# Patient Record
Sex: Male | Born: 1955
Health system: Southern US, Community
[De-identification: ages and names within clinical notes are randomized; demographics above are authoritative.]

---

## 2002-09-12 ENCOUNTER — Emergency Department (HOSPITAL_COMMUNITY): Admission: EM | Admit: 2002-09-12 | Discharge: 2002-09-12 | Payer: Self-pay | Admitting: Emergency Medicine

## 2002-09-12 ENCOUNTER — Encounter: Payer: Self-pay | Admitting: Emergency Medicine

## 2004-10-02 ENCOUNTER — Encounter: Admission: RE | Admit: 2004-10-02 | Discharge: 2004-10-02 | Payer: Self-pay | Admitting: Family Medicine

## 2005-11-21 ENCOUNTER — Encounter: Admission: RE | Admit: 2005-11-21 | Discharge: 2005-11-21 | Payer: Self-pay | Admitting: Family Medicine

## 2013-07-29 ENCOUNTER — Ambulatory Visit (INDEPENDENT_AMBULATORY_CARE_PROVIDER_SITE_OTHER): Payer: BC Managed Care – PPO | Admitting: Licensed Clinical Social Worker

## 2013-07-29 DIAGNOSIS — F4320 Adjustment disorder, unspecified: Secondary | ICD-10-CM

## 2013-08-26 ENCOUNTER — Ambulatory Visit (INDEPENDENT_AMBULATORY_CARE_PROVIDER_SITE_OTHER): Payer: BC Managed Care – PPO | Admitting: Licensed Clinical Social Worker

## 2013-08-26 DIAGNOSIS — F4320 Adjustment disorder, unspecified: Secondary | ICD-10-CM

## 2013-09-09 ENCOUNTER — Ambulatory Visit (INDEPENDENT_AMBULATORY_CARE_PROVIDER_SITE_OTHER): Payer: BC Managed Care – PPO | Admitting: Licensed Clinical Social Worker

## 2013-09-09 DIAGNOSIS — F4320 Adjustment disorder, unspecified: Secondary | ICD-10-CM

## 2013-10-19 ENCOUNTER — Emergency Department (HOSPITAL_COMMUNITY)
Admission: EM | Admit: 2013-10-19 | Discharge: 2013-10-20 | Disposition: A | Discharge status: Home / Self Care | Payer: BC Managed Care – PPO | Attending: Emergency Medicine | Admitting: Emergency Medicine

## 2013-10-19 DIAGNOSIS — IMO0002 Reserved for concepts with insufficient information to code with codable children: Secondary | ICD-10-CM | POA: Diagnosis not present

## 2013-10-19 DIAGNOSIS — T169XXA Foreign body in ear, unspecified ear, initial encounter: Secondary | ICD-10-CM | POA: Diagnosis not present

## 2013-10-19 DIAGNOSIS — Y9289 Other specified places as the place of occurrence of the external cause: Secondary | ICD-10-CM | POA: Insufficient documentation

## 2013-10-19 DIAGNOSIS — Z79899 Other long term (current) drug therapy: Secondary | ICD-10-CM | POA: Insufficient documentation

## 2013-10-19 DIAGNOSIS — T161XXA Foreign body in right ear, initial encounter: Secondary | ICD-10-CM

## 2013-10-19 DIAGNOSIS — Y9389 Activity, other specified: Secondary | ICD-10-CM | POA: Diagnosis not present

## 2013-10-19 MED ORDER — ACETAMINOPHEN-CODEINE #3 300-30 MG PO TABS: 1.0000 | ORAL_TABLET | Freq: Four times a day (QID) | ORAL | Status: DC | PRN

## 2013-10-19 MED ORDER — NAPROXEN 500 MG PO TABS: 500.0000 mg | ORAL_TABLET | Freq: Two times a day (BID) | ORAL | Status: DC | PRN

## 2013-10-19 MED ORDER — LIDOCAINE HCL 2 % IJ SOLN
5.0000 mL | Freq: Once | INTRAMUSCULAR | Status: DC
  Filled 2013-10-19: qty 20

## 2013-10-19 MED ORDER — NEOMYCIN-POLYMYXIN-HC 3.5-10000-1 OT SUSP: 4.0000 [drp] | Freq: Four times a day (QID) | OTIC | Status: DC

## 2013-10-19 NOTE — ED Provider Notes (Signed)
CSN: [id number]     Arrival date & time 10/19/13  2104 History  This chart was scribed for a non-physician practitioner, [place]ita [place] Camprubi-Soms, PA-C working with Merrie Roof, MD by [place] Peace, ED Scribe. The patient was seen in [facility]. The patient's care was started at 10:47 PM.    Chief Complaint  Patient presents with  . Insect in ear      Patient is a 58 y.o. male presenting with foreign body in ear. The history is provided by the patient. No language interpreter was used.  Foreign Body in Ear This is a new problem. The current episode started today. The problem has been unchanged. Pertinent negatives include no headaches, nausea, neck pain, vertigo, visual change or vomiting. Nothing aggravates the symptoms. Treatments tried: irrigation and q-tip. The treatment provided no relief.   HPI Comments: Manuel Ellis is a 58 y.o. Healthy male who presents to the Emergency Department complaining of insect in right ear onset 8:30 PM this evening that occurred while pt was outside doing some yard work when he felt a bug fly in. Pt currently reports that he feels the bug "fluttering" and "moving around". He notes that he is not in any pain but rather "annoyed". Pt reports that he has tried flushing ear out and using a q-tip to remove insect without any success. Pt denies history of DM or similar occurences in the past. Pt denies any tinnitus or drainage in affected ear. He further denies experiencing any headaches, blurred vision, vertigo, nausea, or vomiting. States he feels his hearing is "muted" in that ear.   No past medical history on file. No past surgical history on file. No family history on file. History  Substance Use Topics  . Smoking status: Not on file  . Smokeless tobacco: Not on file  . Alcohol Use: Not on file    Review of Systems  HENT: Positive for hearing loss ("muted"). Negative for ear discharge, ear pain and tinnitus.        Insect in his right  ear.   Eyes: Negative for visual disturbance.  Gastrointestinal: Negative for nausea and vomiting.  Musculoskeletal: Negative for neck pain.  Skin: Negative for wound.  Neurological: Negative for dizziness, vertigo, syncope, light-headedness and headaches.  10 Systems reviewed and are negative for acute change except as noted in the HPI.    Allergies  Review of patient's allergies indicates no known allergies.  Home Medications   Prior to Admission medications   Medication Sig Start Date End Date Taking? Authorizing Provider  Fluticasone-Salmeterol (ADVAIR) 250-50 MCG/DOSE AEPB Inhale 1 puff into the lungs daily as needed. For seasonal allergies   Yes Historical Provider, MD  simvastatin (ZOCOR) 10 MG tablet Take 10 mg by mouth daily.   Yes Historical Provider, MD   BP 142/90  Pulse 78  Temp(Src) 98.5 F (36.9 C) (Oral)  Resp 18  SpO2 95% Physical Exam  Nursing note and vitals reviewed. Constitutional: He is oriented to person, place, and time. Vital signs are normal. He appears well-developed and well-nourished. No distress.  Appears frustrated with buzzing in R ear, otherwise nontoxic and well appearing  HENT:  Head: Normocephalic and atraumatic.  Right Ear: Tympanic membrane normal. No lacerations. There is tenderness (discomfort with pinna movement). No drainage or swelling. A foreign body (insect) is present. No mastoid tenderness. Tympanic membrane is not injected, not perforated and not erythematous. No middle ear effusion. Decreased hearing ("muted") is noted.  Left Ear: Hearing, tympanic  membrane, external ear and ear canal normal.  Nose: Nose normal.  Mouth/Throat: Mucous membranes are normal.  R ear with "muted" hearing, mild discomfort with pinna movement, no drainage or swelling noted to canal but insect occluding full visualization. After insect removed, canal noted to be macerated and erythematous, no swelling or lacerations, TM intact and noninjected. L ear clear   Eyes: Conjunctivae and EOM are normal.  Neck: Normal range of motion. Neck supple.  Cardiovascular: Normal rate.   Pulmonary/Chest: Effort normal. No respiratory distress.  Abdominal: Normal appearance. He exhibits no distension.  Musculoskeletal: Normal range of motion.  Neurological: He is alert and oriented to person, place, and time.  Skin: Skin is warm, dry and intact. No rash noted. No erythema.  Psychiatric: He has a normal mood and affect. His behavior is normal.    ED Course  FOREIGN BODY REMOVAL Date/Time: 10/19/2013 11:11 PM Performed by: Marjean Donna, Daionna Crossland STRUPP Authorized by: Ramond Marrow Consent: Verbal consent obtained. Risks and benefits: risks, benefits and alternatives were discussed Consent given by: patient Patient understanding: patient states understanding of the procedure being performed Patient consent: the patient's understanding of the procedure matches consent given Patient identity confirmed: verbally with patient Body area: ear Location details: right ear Anesthesia: local infiltration Local anesthetic: lidocaine 2% without epinephrine Anesthetic total: 10 ml Patient sedated: no Patient restrained: no Patient cooperative: yes Localization method: ENT speculum Removal mechanism: irrigation and curette Complexity: simple 1 objects recovered. Objects recovered: winged insect, possible bee Post-procedure assessment: foreign body removed Patient tolerance: Patient tolerated the procedure well with no immediate complications.   (including critical care time) Labs Review Labs Reviewed - No data to display  Imaging Review No results found.   EKG Interpretation None     Medications - No data to display  10:49 PM- Treatment plan was discussed with patient who verbalizes understanding and agrees which includes Lidocaine 2%  and irrigation of ear.   11:11 PM- Lidocaine 2% injected into ear and irrigation of ear performed.  Curette used to remove insect from ear. Pt is to follow up with his PCP, Dr. Manus Gunning. He is also advised that to return here to ED if any further problems arise.    MDM   Final diagnoses:  Acute foreign body of ear canal, right, initial encounter    58y/o male with insect in R ear, successfully removed with lido irrigation and lighted curette, but very macerated ear canal noted after. Given cortisporin drops to cover for infection. Pt is not diabetic. Will have him f/up with PCP in 3 days for recheck. Given pain meds for home. I explained the diagnosis and have given explicit precautions to return to the ER including for any other new or worsening symptoms. The patient understands and accepts the medical plan as it's been dictated and I have answered their questions. Discharge instructions concerning home care and prescriptions have been given. The patient is STABLE and is discharged to home in good condition.   I personally performed the services described in this documentation, which was scribed in my presence. The recorded information has been reviewed and is accurate.  BP 142/90  Pulse 78  Temp(Src) 98.5 F (36.9 C) (Oral)  Resp 18  SpO2 95%  Meds ordered this encounter  Medications  . lidocaine (XYLOCAINE) 2 % (with pres) injection 100 mg    Sig:   . acetaminophen-codeine (TYLENOL #3) 300-30 MG per tablet    Sig: Take 1 tablet by mouth every 6 (six)  hours as needed for moderate pain.    Dispense:  10 tablet    Refill:  0    Order Specific Question:  Supervising Provider    Answer:  Eber Hong D [[id number]]  . neomycin-polymyxin-hydrocortisone (CORTISPORIN) 3.5-10000-1 otic suspension    Sig: Place 4 drops into the right ear 4 (four) times daily. X 7 days    Dispense:  10 mL    Refill:  0    Order Specific Question:  Supervising Provider    Answer:  Eber Hong D [[id number]]  . naproxen (NAPROSYN) 500 MG tablet    Sig: Take 1 tablet (500 mg total) by mouth 2 (two) times daily as  needed for mild pain, moderate pain or headache (TAKE WITH MEALS.).    Dispense:  20 tablet    Refill:  0    Order Specific Question:  Supervising Provider    Answer:  Vida Roller [address] Tylea Hise Camprubi-Soms, PA-C 10/19/13 [phone]

## 2013-10-19 NOTE — Discharge Instructions (Signed)
The insect in your ear was removed successfully, but he did cause some irritation in your ear canal. You've been given drops to help with pain, as well as cover you for any infection that may result, use them as directed. Use naprosyn and tylenol #3 as needed, as directed for pain. See your regular doctor for a check up in 3 days. If you develop fevers or pus drainage from your ear, complete loss of hearing, scalp swelling around your ear, or any other worsening or changing symptoms, return to the ER immediately.    Ear Foreign Body An ear foreign body is an object that is stuck in the ear. It is common for young children to put objects into the ear canal. These may include pebbles, beads, beans, and any other small objects which will fit. In adults, objects such as cotton swabs may become lodged in the ear canal. In all ages, the most common foreign bodies are insects that enter the ear canal.  SYMPTOMS  Foreign bodies may cause pain, buzzing or roaring sounds, hearing loss, and ear drainage.  HOME CARE INSTRUCTIONS   Keep all follow-up appointments with your caregiver as told.  Keep small objects out of reach of young children. Tell them not to put anything in their ears. SEEK IMMEDIATE MEDICAL CARE IF:   You have bleeding from the ear.  You have increased pain or swelling of the ear.  You have reduced hearing.  You have discharge coming from the ear.  You have a fever.  You have a headache. MAKE SURE YOU:   Understand these instructions.  Will watch your condition.  Will get help right away if you are not doing well or get worse. Document Released: 01/14/2000 Document Revised: 04/10/2011 Document Reviewed: 09/04/2007 [facility] Patient Information 2015 [place], [place]. This information is not intended to replace advice given to you by your health care provider. Make sure you discuss any questions you have with your health care provider.

## 2013-10-19 NOTE — ED Notes (Addendum)
Pt states an insect flew in his right ear while he was doing yard work at 830pm today. Pt states the insect is not causing pain, but is "annoying".  Pt states he tried flushing the insect out with water and using a q-tip without success.

## 2013-10-20 NOTE — ED Provider Notes (Signed)
Medical screening examination/treatment/procedure(s) were performed by non-physician practitioner and as supervising physician I was immediately available for consultation/collaboration.   EKG Interpretation None        Konrad Hoak David Emiliya Chretien III, MD 10/20/13 0002 

## 2014-08-11 ENCOUNTER — Ambulatory Visit: Payer: Self-pay | Admitting: Licensed Clinical Social Worker

## 2015-05-25 ENCOUNTER — Other Ambulatory Visit: Payer: Self-pay | Admitting: Sports Medicine

## 2015-05-25 DIAGNOSIS — M25511 Pain in right shoulder: Secondary | ICD-10-CM

## 2015-06-01 ENCOUNTER — Ambulatory Visit
Admission: RE | Admit: 2015-06-01 | Discharge: 2015-06-01 | Disposition: A | Discharge status: Home / Self Care | Payer: Self-pay | Source: Ambulatory Visit | Attending: Sports Medicine | Admitting: Sports Medicine

## 2015-06-01 ENCOUNTER — Ambulatory Visit
Admission: RE | Admit: 2015-06-01 | Discharge: 2015-06-01 | Disposition: A | Discharge status: Home / Self Care | Payer: BLUE CROSS/BLUE SHIELD | Source: Ambulatory Visit | Attending: Sports Medicine | Admitting: Sports Medicine

## 2015-06-01 DIAGNOSIS — M25511 Pain in right shoulder: Secondary | ICD-10-CM

## 2015-06-01 MED ORDER — IOPAMIDOL (ISOVUE-M 200) INJECTION 41%
15.0000 mL | Freq: Once | INTRAMUSCULAR | Status: AC
  Administered 2015-06-01: 15 mL via INTRA_ARTICULAR

## 2015-11-22 ENCOUNTER — Ambulatory Visit (INDEPENDENT_AMBULATORY_CARE_PROVIDER_SITE_OTHER): Payer: Self-pay | Admitting: Orthopedic Surgery

## 2015-12-02 ENCOUNTER — Ambulatory Visit (INDEPENDENT_AMBULATORY_CARE_PROVIDER_SITE_OTHER): Payer: BLUE CROSS/BLUE SHIELD | Admitting: Orthopedic Surgery

## 2015-12-02 ENCOUNTER — Encounter (INDEPENDENT_AMBULATORY_CARE_PROVIDER_SITE_OTHER): Payer: Self-pay | Admitting: Orthopedic Surgery

## 2015-12-02 DIAGNOSIS — M75121 Complete rotator cuff tear or rupture of right shoulder, not specified as traumatic: Secondary | ICD-10-CM

## 2015-12-02 NOTE — Progress Notes (Signed)
   Post-Op Visit Note   Patient: Manuel Ellis           Date of Birth: 01/30/1956           MRN: [id number] Visit Date: 12/02/2015 PCP: Thora LanceEHINGER,ROBERT R, MD   Assessment & Plan:  Chief Complaint:  Chief Complaint  Patient presents with  . Right Shoulder - Follow-up   Visit Diagnoses:  1. Complete tear of right rotator cuff   Manuel Ellis is a 60 year old patient who is about 5 months out right shoulder arthroscopy biceps tendon tenodesis rotator cuff repair generally doing well in excess program twice a week therapy once a week works as a Designer, industrial/productwarehouse manager is doing weights exercises with also  a band  Plan: Manuel Ellis is doing well following his shoulder surgery.  He has excellent range of motion and improving strength.  Smooth range with active and passive range of motion on exam today.  I did let him transition out of therapy into a home exercise program over the next month follow-up with me as needed  Follow-Up Instructions: No Follow-up on file.   Orders:  No orders of the defined types were placed in this encounter.  No orders of the defined types were placed in this encounter.  All systems reviewed negative as they relate to the chief complaint On exam he has full active and passive range of motion of right shoulder with good rotator cuff strength on the right-hand side interspace express upset muscle testing.  No Popeye deformity and biceps region.  He is otherwise appears healthy while the [place] 60 stress are nontender white mass index Florastor effort normal heart rate normal mood and affect normal skin no lymphadenopathy PMFS History: There are no active problems to display for this patient.  No past medical history on file.  No family history on file.  No past surgical history on file. Social History   Occupational History  . Not on file.   Social History Main Topics  . Smoking status: Never Smoker  . Smokeless tobacco: Never Used  . Alcohol use Yes    Comment: occads  . Drug use: No  . Sexual activity: Not on file

## 2018-02-11 ENCOUNTER — Ambulatory Visit (INDEPENDENT_AMBULATORY_CARE_PROVIDER_SITE_OTHER): Payer: BLUE CROSS/BLUE SHIELD | Admitting: Family Medicine

## 2018-02-11 ENCOUNTER — Ambulatory Visit (INDEPENDENT_AMBULATORY_CARE_PROVIDER_SITE_OTHER): Payer: Self-pay

## 2018-02-11 ENCOUNTER — Encounter (INDEPENDENT_AMBULATORY_CARE_PROVIDER_SITE_OTHER): Payer: Self-pay | Admitting: Family Medicine

## 2018-02-11 DIAGNOSIS — M545 Low back pain, unspecified: Secondary | ICD-10-CM

## 2018-02-11 DIAGNOSIS — M25562 Pain in left knee: Secondary | ICD-10-CM

## 2018-02-11 MED ORDER — NABUMETONE 500 MG PO TABS: 500.0000 mg | ORAL_TABLET | Freq: Two times a day (BID) | ORAL | 3 refills | Status: DC | PRN

## 2018-02-11 MED ORDER — BACLOFEN 10 MG PO TABS: 5.0000 mg | ORAL_TABLET | Freq: Every evening | ORAL | 3 refills | Status: DC | PRN

## 2018-02-11 NOTE — Patient Instructions (Signed)
 -   Stop simvastatin/Zocor for 3-4 weeks to see if pain improves.  - Start vitamin D3 at 5,000 IU daily.  Have levels checked in about 6 months (goal level is 50-80).  - I will order a Donjoy Osteoarthritis Knee Brace to try.

## 2018-02-11 NOTE — Progress Notes (Signed)
Office Visit Note   Patient: Manuel Ellis           Date of Birth: 1955/02/03           MRN: [id number] Visit Date: 02/11/2018 Requested by: Blair Heys, MD [address] [place], [place] [address] PCP: Blair Heys, MD  Subjective: Chief Complaint  Patient presents with  . Left Knee - Pain    Pain x years, gradually worsening. Pain medial and lateral, but mainly medial. Wears open patella knee sleeve - helps. Meloxicam. Turmeric. Still walks 3 miles per week - pain afterward.  . Lower Back - Pain    Pain x years. Flareups more frequently lately. This pain is from last week - unsure of any incident. No radiating pain/numbness/tingling. Pain more on right side than left.    HPI: He is a 63 year old with low back and left knee pain.  He has had intermittent pain in both knees for the past couple years.  In the past month or so his pain has worsened on the left.  Pain is mainly on the medial side of his knee, it hurts sometimes while exercise walking but usually the pain is worse the next day.  Meloxicam helps.  He also takes glucosamine and turmeric.  He was told in the past that he had arthritis in his knees.  He denies any locking or catching.  His low back has been hurting for the past few weeks, no definite injury.  Midline pain without radicular symptoms, hurts to stand up straight.               ROS: He has been on simvastatin for hyperlipidemia for the past couple years.  He thinks it made his knee pain worse.  Denies any history of cardiac disease.  He has hypertension which has been well controlled.  All other systems were reviewed and are negative.  Objective: Vital Signs: There were no vitals taken for this visit.  Physical Exam:  Low back: No visible rash, slightly tender to palpation in the midline at the L5-S1 level.  Pain in the paraspinous muscles as well.  No pain over the SI joints or in the sciatic notch, negative straight leg raise.  Lower  extremity strength and reflexes are normal. Left knee: Trace effusion, no warmth erythema.  1+ patellofemoral crepitus, no pain with patella compression test.  Good range of motion of both hips with no pain on internal rotation.  Left knee Lockman's is solid.  He has 1+ laxity with valgus stress but a solid endpoint.  There is some tenderness along the medial joint line but no palpable click with McMurray's.  There is tenderness on the posterior lateral joint line with a palpable click on McMurray's.  Imaging: X-rays lumbar spine: Grade 1 anterolisthesis of L4 on L5 with degenerative disc disease at L4-5 and L5-S1.  He has facet arthropathy as well, no definite pars defects. Left knee: Moderate medial compartment narrowing with periarticular spurring.  No definite loose body, no sign of neoplasm or stress fracture.   Assessment & Plan: 1.  Chronic left greater than right knee pain due to mainly medial compartment DJD -We will try different medication, medial compartment unloading brace.  He will stop cholesterol medicine for a month and if pain improves significantly he will contact his PCP to possibly try something different.  2.  Low back pain with degenerative disc disease, neurologic exam nonfocal. -Medicines as above, trial of chiropractic per Dr.  Hollice Espy.   Follow-Up Instructions: No follow-ups on file.      Procedures: No procedures performed  No notes on file    PMFS History: There are no active problems to display for this patient.  History reviewed. No pertinent past medical history.  History reviewed. No pertinent family history.  History reviewed. No pertinent surgical history. Social History   Occupational History  . Not on file  Tobacco Use  . Smoking status: Never Smoker  . Smokeless tobacco: Never Used  Substance and Sexual Activity  . Alcohol use: Yes    Comment: occads  . Drug use: No  . Sexual activity: Not on file

## 2018-02-12 ENCOUNTER — Telehealth (INDEPENDENT_AMBULATORY_CARE_PROVIDER_SITE_OTHER): Payer: Self-pay | Admitting: Family Medicine

## 2018-02-12 NOTE — Telephone Encounter (Signed)
Emailed order for compartment unloading brace along with demographics to [facility] with [facility]

## 2018-11-05 ENCOUNTER — Other Ambulatory Visit: Payer: Self-pay

## 2018-11-05 DIAGNOSIS — Z20822 Contact with and (suspected) exposure to covid-19: Secondary | ICD-10-CM

## 2018-11-07 LAB — NOVEL CORONAVIRUS, NAA: SARS-CoV-2, NAA: NOT DETECTED

## 2018-12-10 ENCOUNTER — Other Ambulatory Visit: Payer: Self-pay

## 2018-12-10 DIAGNOSIS — Z20822 Contact with and (suspected) exposure to covid-19: Secondary | ICD-10-CM

## 2018-12-12 LAB — NOVEL CORONAVIRUS, NAA: SARS-CoV-2, NAA: NOT DETECTED

## 2019-01-22 ENCOUNTER — Ambulatory Visit (INDEPENDENT_AMBULATORY_CARE_PROVIDER_SITE_OTHER): Payer: BC Managed Care – PPO | Admitting: Family Medicine

## 2019-01-22 ENCOUNTER — Other Ambulatory Visit: Payer: Self-pay

## 2019-01-22 ENCOUNTER — Ambulatory Visit: Payer: BC Managed Care – PPO

## 2019-01-22 ENCOUNTER — Encounter: Payer: Self-pay | Admitting: Family Medicine

## 2019-01-22 DIAGNOSIS — M25512 Pain in left shoulder: Secondary | ICD-10-CM

## 2019-01-22 NOTE — Progress Notes (Signed)
   Office Visit Note   Patient: Manuel Ellis           Date of Birth: February 17, 1955           MRN: [id number] Visit Date: 01/22/2019 Requested by: Gaynelle Arabian, MD [address] [place],  [place] [address] PCP: Gaynelle Arabian, MD  Subjective: Chief Complaint  Patient presents with  . Left Shoulder - Pain    Pain in the shoulder since 01/18/19. Patient was walking his big dog when another dog came near - his dog moved quickly toward the other dog, yanking the patient's shoulder - "snap, crackle & pop." Weakness, stiffness, & soreness.    HPI: He is here with left shoulder pain.  He is right-hand dominant.  4 days ago he was walking his dog, his dog pulled hard on the leash trying to move toward another dog and his arm was pulled across his body.  He felt something pop and crack in his arm and has had difficulty lifting it overhead since then.  His pain is surprisingly minimal, nowhere near as severe as when he tore his right shoulder rotator cuff.  Pain does not keep him awake at night.  He has not noticed a deformity in his arm, has not had any bruising.              ROS: No fevers or chills.  All other systems were reviewed and are negative.  Objective: Vital Signs: There were no vitals taken for this visit.  Physical Exam:  General:  Alert and oriented, in no acute distress. Pulm:  Breathing unlabored. Psy:  Normal mood, congruent affect. Skin: No bruising or erythema. Left shoulder: He has decreased active overhead reach compared to the right.  No tenderness at the [facility] joint or the long head biceps tendon.  He has substantial weakness with supraspinatus and infraspinatus testing, good strength with subscapularis testing.  Imaging: Diagnostic ultrasound left shoulder: Long head biceps tendon is in its groove.  There is slight amount of fluid in the sheath.  Subscapularis looks intact.  Supraspinatus has a partial insertional tear at the mid portion with about 1/2 cm  retraction.  There is an intrasubstance tear as well.  Infraspinatus also has an insertional partial tear with a small amount of retraction.    Assessment & Plan: 1.  Left shoulder acute partial tear of supraspinatus and infraspinatus tendons -Discussed options with patient, he is very reluctant to pursue surgery given how difficult it was to recover from his right shoulder rotator cuff repair.  He would like to try physical therapy first.  Prescription [facility] to [facility]. -If he fails to improve, he will contact me and I will order MRI scan followed by consultation with Dr. Marlou Sa.     Procedures: No procedures performed  No notes on file     PMFS History: There are no problems to display for this patient.  History reviewed. No pertinent past medical history.  History reviewed. No pertinent family history.  History reviewed. No pertinent surgical history. Social History   Occupational History  . Not on file  Tobacco Use  . Smoking status: Never Smoker  . Smokeless tobacco: Never Used  Substance and Sexual Activity  . Alcohol use: Yes    Comment: occads  . Drug use: No  . Sexual activity: Not on file

## 2019-01-27 ENCOUNTER — Encounter: Payer: Self-pay | Admitting: Family Medicine

## 2019-02-14 ENCOUNTER — Encounter: Payer: Self-pay | Admitting: Orthopedic Surgery

## 2019-02-14 ENCOUNTER — Other Ambulatory Visit: Payer: Self-pay

## 2019-02-14 ENCOUNTER — Ambulatory Visit (INDEPENDENT_AMBULATORY_CARE_PROVIDER_SITE_OTHER): Payer: BC Managed Care – PPO | Admitting: Orthopedic Surgery

## 2019-02-14 ENCOUNTER — Ambulatory Visit: Payer: Self-pay

## 2019-02-14 DIAGNOSIS — M25512 Pain in left shoulder: Secondary | ICD-10-CM | POA: Diagnosis not present

## 2019-02-14 DIAGNOSIS — S46012A Strain of muscle(s) and tendon(s) of the rotator cuff of left shoulder, initial encounter: Secondary | ICD-10-CM | POA: Diagnosis not present

## 2019-02-14 NOTE — Progress Notes (Signed)
Office Visit Note   Patient: Manuel Ellis           Date of Birth: 07/05/1955           MRN: [id number] Visit Date: 02/14/2019 Requested by: Blair Heys, MD [address] [place],  [place] [address] PCP: Blair Heys, MD  Subjective: Chief Complaint  Patient presents with  . Left Shoulder - Pain    HPI: Manuel Ellis is a 64 y.o. male who presents to the office complaining of left shoulder pain.  Patient notes an injury 4 weeks ago while he was walking his dog.  The dog suddenly pulled his arm across his body and patient heard a pop.  He notes initially he was able to move his arm functionally but quickly lost function and is unable to lift his arm fully as he can in the contralateral side.  He denies any neck pain, radicular symptoms, numbness/tingling.  Localizes pain to the anterior and posterior aspects of the shoulder with no radiation.  He takes Aleve as needed that provides good relief.  Loss of function is his main complaint and pain is not bothering him too bad.  He has a previous rotator cuff repair by Dr. August Saucer in 2017 for a supraspinatus tear.  He was seen by Dr. Prince Rome and was found to have rotator cuff tear on ultrasound..                ROS:  All systems reviewed are negative as they relate to the chief complaint within the history of present illness.  Patient denies fevers or chills.  Assessment & Plan: Visit Diagnoses:  1. Traumatic tear of left rotator cuff, unspecified tear extent, initial encounter   2. Left shoulder pain, unspecified chronicity     Plan: Patient is a 64 year old male who presents complaining of left shoulder pain and dysfunction.  This is following a left shoulder injury while walking his dog.  He has history of rotator cuff repair on the right side 3 years ago but had difficulty with his recovery and wishes to pursue nonoperative management if possible.  However he has significant weakness on exam with infraspinatus  primarily as well as supraspinatus and depending on how the MRI looks this may not be possible.  Patient understands this.  Ordered left shoulder x-rays which were negative for any acute pathology.  Ordered MRI arthrogram of the left shoulder.  Patient will follow-up after MRI to review results.  Based on his exam this looks like full-thickness rotator cuff pathology which is unlikely to significantly improved with physical therapy and delaying treatment could make the ultimate repair more difficult.  Patient understands the rationale behind obtaining full information with the MRI scan prior to making a decision for or against operative therapy.  Follow-Up Instructions: No follow-ups on file.   Orders:  Orders Placed This Encounter  Procedures  . XR Shoulder Left  . MR Shoulder Left w/ contrast  . DL FLUORO GUIDED NEEDLE PLC ASPIRATION / INJECTTION/LOC   No orders of the defined types were placed in this encounter.     Procedures: No procedures performed   Clinical Data: No additional findings.  Objective: Vital Signs: There were no vitals taken for this visit.  Physical Exam:  Constitutional: Patient appears well-developed HEENT:  Head: Normocephalic Eyes:EOM are normal Neck: Normal range of motion Cardiovascular: Normal rate Pulmonary/chest: Effort normal Neurologic: Patient is alert Skin: Skin is warm Psychiatric: Patient has normal mood and  affect  Ortho Exam:   Left shoulder Exam Able to forward flex left arm to about 80 degrees compared with full forward flexion on the contralateral side No loss of ER relative to the other shoulder.  Good endpoint with ER No TTP over the [facility] joint or bicipital groove 5/5 motor strength of the subscapularis muscle.  Marked weakness of the infraspinatus and supraspinatus muscle Negative Hawkins impingement 5/5 grip strength, forearm pronation/supination, and bicep strength  Specialty Comments:  No specialty comments  available.  Imaging: No results found.   PMFS History: There are no problems to display for this patient.  No past medical history on file.  No family history on file.  No past surgical history on file. Social History   Occupational History  . Not on file  Tobacco Use  . Smoking status: Never Smoker  . Smokeless tobacco: Never Used  Substance and Sexual Activity  . Alcohol use: Yes    Comment: occads  . Drug use: No  . Sexual activity: Not on file

## 2019-02-25 ENCOUNTER — Telehealth: Payer: Self-pay | Admitting: Family Medicine

## 2019-02-25 NOTE — Telephone Encounter (Signed)
01/22/2019 ov note faxed to [facility]. [phone], Annabelle Harman [phone]. Patient has upcoming appt for shld eval

## 2019-03-14 ENCOUNTER — Other Ambulatory Visit: Payer: Self-pay

## 2019-03-14 ENCOUNTER — Ambulatory Visit
Admission: RE | Admit: 2019-03-14 | Discharge: 2019-03-14 | Disposition: A | Discharge status: Home / Self Care | Payer: BC Managed Care – PPO | Source: Ambulatory Visit | Attending: Orthopedic Surgery | Admitting: Orthopedic Surgery

## 2019-03-14 DIAGNOSIS — M25512 Pain in left shoulder: Secondary | ICD-10-CM

## 2019-03-14 MED ORDER — IOPAMIDOL (ISOVUE-M 200) INJECTION 41%
12.0000 mL | Freq: Once | INTRAMUSCULAR | Status: AC
  Administered 2019-03-14: 16:00:00 12 mL via INTRA_ARTICULAR

## 2019-03-19 ENCOUNTER — Other Ambulatory Visit: Payer: Self-pay

## 2019-03-19 ENCOUNTER — Ambulatory Visit (INDEPENDENT_AMBULATORY_CARE_PROVIDER_SITE_OTHER): Payer: BC Managed Care – PPO | Admitting: Orthopedic Surgery

## 2019-03-19 DIAGNOSIS — S46012A Strain of muscle(s) and tendon(s) of the rotator cuff of left shoulder, initial encounter: Secondary | ICD-10-CM

## 2019-03-20 ENCOUNTER — Encounter: Payer: Self-pay | Admitting: Orthopedic Surgery

## 2019-03-20 NOTE — Progress Notes (Signed)
Office Visit Note   Patient: Manuel Ellis           Date of Birth: 09/13/1955           MRN: [id number] Visit Date: 03/19/2019 Requested by: Gaynelle Arabian, MD [address] [place],  [place] [address] PCP: Gaynelle Arabian, MD  Subjective: Chief Complaint  Patient presents with  . Left Shoulder - Pain    HPI: Manuel Ellis is a 64 year old patient with left shoulder pain.  He is in therapy.  Here for MRI review.  He does have full-thickness retracted tear of the supraspinatus and part of the infraspinatus.  Tears of retracted to the top of the humeral head.  They still look repairable at this time.  He is currently unemployed and money is a problem.  This is affecting some of his decision-making.  He is able to sleep but reports mild pain.  Had right shoulder rotator cuff surgery.  Those records are reviewed and the tear was much smaller only 5 mm and not really retracted.  This is a bigger tear.  He is considering going back to work in [employer] which is nonphysical.              ROS: All systems reviewed are negative as they relate to the chief complaint within the history of present illness.  Patient denies  fevers or chills.   Assessment & Plan: Visit Diagnoses:  1. Traumatic tear of left rotator cuff, unspecified tear extent, initial encounter     Plan: Impression is retracted posterior superior rotator cuff tear which looks repairable at this time.  I do not think it will stay repairable forever.  I think his best option for long-term function of his shoulder particularly based on the weakness and loss of active motion he has now would be rotator cuff repair.  He is can consider his options.  I think we have about 4 to 6 months before this tear retraction often becomes unrepairable.  Importantly there is fairly minimal atrophy in the supraspinatus and infraspinatus muscle bellies at this time.  All this is explained to the patient and the scan is also reviewed with  him.  Follow-up with me as needed.  Follow-Up Instructions: Return if symptoms worsen or fail to improve.   Orders:  No orders of the defined types were placed in this encounter.  No orders of the defined types were placed in this encounter.     Procedures: No procedures performed   Clinical Data: No additional findings.  Objective: Vital Signs: There were no vitals taken for this visit.  Physical Exam:   Constitutional: Patient appears well-developed HEENT:  Head: Normocephalic Eyes:EOM are normal Neck: Normal range of motion Cardiovascular: Normal rate Pulmonary/chest: Effort normal Neurologic: Patient is alert Skin: Skin is warm Psychiatric: Patient has normal mood and affect    Ortho Exam: Ortho exam demonstrates below 90 degrees of forward flexion abduction with that left shoulder.  Passively the shoulder does have full motion comparable to the right-hand side.  No restriction of external rotation of 15 degrees of abduction on the left.  He has infraspinatus weakness and supraspinatus weakness on exam on the left-hand side at 3+ out of 5 compared to 5+ out of 5 on the right.  No AC joint tenderness to direct palpation.  No masses lymphadenopathy or skin changes noted in the left shoulder girdle region  Specialty Comments:  No specialty comments available.  Imaging: No results found.  PMFS History: There are no problems to display for this patient.  History reviewed. No pertinent past medical history.  History reviewed. No pertinent family history.  History reviewed. No pertinent surgical history. Social History   Occupational History  . Not on file  Tobacco Use  . Smoking status: Never Smoker  . Smokeless tobacco: Never Used  Substance and Sexual Activity  . Alcohol use: Yes    Comment: occads  . Drug use: No  . Sexual activity: Not on file

## 2019-05-22 ENCOUNTER — Ambulatory Visit (INDEPENDENT_AMBULATORY_CARE_PROVIDER_SITE_OTHER): Payer: BC Managed Care – PPO | Admitting: Orthopedic Surgery

## 2019-05-22 ENCOUNTER — Other Ambulatory Visit: Payer: Self-pay

## 2019-05-22 DIAGNOSIS — S46012A Strain of muscle(s) and tendon(s) of the rotator cuff of left shoulder, initial encounter: Secondary | ICD-10-CM | POA: Diagnosis not present

## 2019-05-23 ENCOUNTER — Encounter: Payer: Self-pay | Admitting: Orthopedic Surgery

## 2019-05-23 NOTE — Progress Notes (Signed)
Office Visit Note   Patient: Manuel Ellis           Date of Birth: 05/18/1955           MRN: [id number] Visit Date: 05/22/2019 Requested by: Blair Heys, MD [address] [place],  [place] [address] PCP: Blair Heys, MD  Subjective: Chief Complaint  Patient presents with  . Left Shoulder - Pain    HPI: Manuel Ellis is a 64 y.o. male who presents to the office complaining of left shoulder pain. He returns to the clinic to discuss surgery. He is right-hand dominant individual. He has history of right shoulder rotator cuff repair. He has been doing physical therapy for his left shoulder rotator cuff tear. He has transition to home exercise program currently. It has helped but no significant relief. Want surgery in late June or early July. He had Covid 6 weeks ago and is feeling well from that now. He has no history of left shoulder surgery. No history of diabetes, smoking, DVT or pulmonary embolism.  He is doing well from his right shoulder rotator cuff repair..                ROS:  All systems reviewed are negative as they relate to the chief complaint within the history of present illness.  Patient denies fevers or chills.  Assessment & Plan: Visit Diagnoses:  1. Traumatic tear of left rotator cuff, unspecified tear extent, initial encounter     Plan: Patient is a 64 year old male who presents complaining of continued left shoulder pain. He has a large full-thickness partial tear of the supraspinatus that is retracted 1.9 cm as well as moderate infraspinatus tendinopathy. He has been trying to rehab this with physical therapy without any significant long-lasting relief. He comes now to schedule surgery. Plan for left shoulder arthroscopy with mini open rotator cuff repair and biceps tenodesis. Discussed the risks and benefits of surgery. After lengthy discussion, patient still wishes to proceed. Plan for surgery in late June or early July. Follow-up after.   Plan to use shoulder CPM for 2 weeks after surgery followed by initiation of physical therapy.  Follow-Up Instructions: No follow-ups on file.   Orders:  No orders of the defined types were placed in this encounter.  No orders of the defined types were placed in this encounter.     Procedures: No procedures performed   Clinical Data: No additional findings.  Objective: Vital Signs: There were no vitals taken for this visit.  Physical Exam:  Constitutional: Patient appears well-developed HEENT:  Head: Normocephalic Eyes:EOM are normal Neck: Normal range of motion Cardiovascular: Normal rate Pulmonary/chest: Effort normal Neurologic: Patient is alert Skin: Skin is warm Psychiatric: Patient has normal mood and affect  Ortho Exam:  Left shoulder Exam Able to forward flex and abduct shoulder overhead. Mild stiffness in abduction and forward flexion compared with contralateral side. No loss of ER relative to the other shoulder.  Good endpoint with ER No TTP over the [facility] joint or bicipital groove Good subscapularis strength. Mild weakness with supraspinatus resistance testing. Mild weakness with infraspinatus resistance testing. Negative Hawkins impingement 5/5 grip strength, forearm pronation/supination, and bicep strength  Specialty Comments:  No specialty comments available.  Imaging: No results found.   PMFS History: There are no problems to display for this patient.  No past medical history on file.  No family history on file.  No past surgical history on file. Social History   Occupational  History  . Not on file  Tobacco Use  . Smoking status: Never Smoker  . Smokeless tobacco: Never Used  Substance and Sexual Activity  . Alcohol use: Yes    Comment: occads  . Drug use: No  . Sexual activity: Not on file

## 2019-05-24 ENCOUNTER — Encounter: Payer: Self-pay | Admitting: Orthopedic Surgery

## 2019-07-17 ENCOUNTER — Telehealth: Payer: Self-pay | Admitting: Orthopedic Surgery

## 2019-07-17 NOTE — Telephone Encounter (Signed)
Patient called stating that the chair that Dr. August Saucer is wanting the patient to use is not covered under his insurance and was advised that the bladder is covered.  He wanted to know if that would be okay for him to use the bladder instead of paying the $750.00 OOP for the chair.  CB#[phone].  Thank you.

## 2019-07-17 NOTE — Telephone Encounter (Signed)
sure

## 2019-07-17 NOTE — Telephone Encounter (Signed)
IC advised ok for this.

## 2019-07-17 NOTE — Telephone Encounter (Signed)
Please advise. Thanks.  

## 2019-07-21 ENCOUNTER — Other Ambulatory Visit: Payer: Self-pay | Admitting: Surgical

## 2019-07-21 ENCOUNTER — Telehealth: Payer: Self-pay | Admitting: Orthopedic Surgery

## 2019-07-21 DIAGNOSIS — M7522 Bicipital tendinitis, left shoulder: Secondary | ICD-10-CM | POA: Diagnosis not present

## 2019-07-21 DIAGNOSIS — M75122 Complete rotator cuff tear or rupture of left shoulder, not specified as traumatic: Secondary | ICD-10-CM

## 2019-07-21 MED ORDER — OXYCODONE-ACETAMINOPHEN 5-325 MG PO TABS: 1.0000 | ORAL_TABLET | ORAL | 0 refills | Status: AC | PRN

## 2019-07-21 MED ORDER — METHOCARBAMOL 500 MG PO TABS: 500.0000 mg | ORAL_TABLET | Freq: Three times a day (TID) | ORAL | 0 refills | Status: AC | PRN

## 2019-07-21 NOTE — Telephone Encounter (Signed)
I spoke with Manuel Ellis patient will be set up first thing in the morning. Patient is aware.

## 2019-07-21 NOTE — Telephone Encounter (Signed)
Pt would like a CB in regards to post op equipment for PT.  [phone]

## 2019-07-24 ENCOUNTER — Telehealth: Payer: Self-pay | Admitting: Orthopedic Surgery

## 2019-07-24 NOTE — Telephone Encounter (Signed)
Can either of you please advise? Thanks 

## 2019-07-24 NOTE — Telephone Encounter (Signed)
Patient called. Would like to know if he can drive on Monday. His call back number is [phone]

## 2019-07-25 ENCOUNTER — Telehealth: Payer: Self-pay | Admitting: Orthopedic Surgery

## 2019-07-25 NOTE — Telephone Encounter (Signed)
S/w patient and advised. Verbalized understanding.  

## 2019-07-25 NOTE — Telephone Encounter (Signed)
Yes it is his decision to drive but should be off pain meds and most with this surgery do not drive for first 2 weeks after surgery

## 2019-07-25 NOTE — Telephone Encounter (Signed)
Patient called referencing whether or not he could drive? I read the note from Dr August Saucer to him. Patient said he will give it a try. The number to contact patient is   [phone]

## 2019-07-28 ENCOUNTER — Inpatient Hospital Stay: Payer: BC Managed Care – PPO | Admitting: Orthopedic Surgery

## 2019-07-29 ENCOUNTER — Ambulatory Visit (INDEPENDENT_AMBULATORY_CARE_PROVIDER_SITE_OTHER): Payer: BC Managed Care – PPO | Admitting: Orthopedic Surgery

## 2019-07-29 DIAGNOSIS — S46012A Strain of muscle(s) and tendon(s) of the rotator cuff of left shoulder, initial encounter: Secondary | ICD-10-CM

## 2019-07-31 ENCOUNTER — Encounter: Payer: Self-pay | Admitting: Orthopedic Surgery

## 2019-07-31 NOTE — Progress Notes (Signed)
   Post-Op Visit Note   Patient: Manuel Ellis           Date of Birth: 07-13-1955           MRN: [id number] Visit Date: 07/29/2019 PCP: Blair Heys, MD   Assessment & Plan:  Chief Complaint:  Chief Complaint  Patient presents with  . Left Shoulder - Routine Post Op   Visit Diagnoses:  1. Traumatic tear of left rotator cuff, unspecified tear extent, initial encounter     Plan: Sheena is a 64 year old patient who underwent left shoulder rotator cuff repair a week ago.  He is at 75 degrees on the CPM machine.  Taking oxycodone as needed.  On exam he has about 20 degrees of passive external rotation 60 degrees of abduction and 75 of forward flexion passively.  Rena send him back to [facility] physical therapy for rehab.  Operative note sent as well.  Incisions intact.  Come back in 3 weeks for clinical recheck.  Follow-Up Instructions: No follow-ups on file.   Orders:  No orders of the defined types were placed in this encounter.  No orders of the defined types were placed in this encounter.   Imaging: No results found.  PMFS History: There are no problems to display for this patient.  No past medical history on file.  No family history on file.  No past surgical history on file. Social History   Occupational History  . Not on file  Tobacco Use  . Smoking status: Never Smoker  . Smokeless tobacco: Never Used  Substance and Sexual Activity  . Alcohol use: Yes    Comment: occads  . Drug use: No  . Sexual activity: Not on file

## 2019-08-11 ENCOUNTER — Encounter: Payer: Self-pay | Admitting: Orthopedic Surgery

## 2019-08-11 ENCOUNTER — Ambulatory Visit (INDEPENDENT_AMBULATORY_CARE_PROVIDER_SITE_OTHER): Payer: BC Managed Care – PPO | Admitting: Orthopedic Surgery

## 2019-08-11 DIAGNOSIS — S46012A Strain of muscle(s) and tendon(s) of the rotator cuff of left shoulder, initial encounter: Secondary | ICD-10-CM

## 2019-08-11 NOTE — Progress Notes (Signed)
   Post-Op Visit Note   Patient: Manuel Ellis           Date of Birth: 1955-09-26           MRN: [id number] Visit Date: 08/11/2019 PCP: Manuel Heys, MD   Assessment & Plan:  Chief Complaint:  Chief Complaint  Patient presents with  . Left Shoulder - Routine Post Op   Visit Diagnoses:  1. Traumatic tear of left rotator cuff, unspecified tear extent, initial encounter     Plan: [name] is now about 4 weeks out left shoulder rotator cuff repair.  On exam he has reasonable passive range of motion.  Still little tight in external rotation at about 20 to 30 degrees.  Not much coarseness or grinding with passive range of motion.  He is going to start doing some passive stretching with therapy this week.  He did do the CPM machine.  I would like him to discontinue the sling but he says it makes it feel little bit more supported.  We will taper that off over the next 2 weeks and then start strengthening in 2 weeks in therapy.  Come back in 4 weeks for clinical recheck.  Follow-Up Instructions: No follow-ups on file.   Orders:  No orders of the defined types were placed in this encounter.  No orders of the defined types were placed in this encounter.   Imaging: No results found.  PMFS History: There are no problems to display for this patient.  No past medical history on file.  No family history on file.  No past surgical history on file. Social History   Occupational History  . Not on file  Tobacco Use  . Smoking status: Never Smoker  . Smokeless tobacco: Never Used  Substance and Sexual Activity  . Alcohol use: Yes    Comment: occads  . Drug use: No  . Sexual activity: Not on file

## 2019-08-29 ENCOUNTER — Telehealth: Payer: Self-pay | Admitting: Orthopedic Surgery

## 2019-08-29 NOTE — Telephone Encounter (Signed)
Patient called advised his employer need a note stating any and all restrictions patient may have. Patient said he returns to work 09/08/2019. Patient will pick up note next week. The number to contact patient is [phone]

## 2019-08-29 NOTE — Telephone Encounter (Signed)
Form received from [employer]. Sent to [employer].

## 2019-09-01 NOTE — Telephone Encounter (Signed)
Dean patient 

## 2019-09-02 NOTE — Telephone Encounter (Signed)
Pls advise.  

## 2019-09-02 NOTE — Telephone Encounter (Signed)
It looks like we are going to decide about return to work status when he comes back on August 12

## 2019-09-02 NOTE — Telephone Encounter (Signed)
Please advise. Thanks.  

## 2019-09-03 ENCOUNTER — Telehealth: Payer: Self-pay | Admitting: Orthopedic Surgery

## 2019-09-03 NOTE — Telephone Encounter (Signed)
Patient called concerning return to work status. I read note to patient from Dr August Saucer and he's asking for a call back as soon as possible. Patient said he was suppose to go back to work he thought 09/08/2019.  The number to contact patient is [phone]

## 2019-09-03 NOTE — Telephone Encounter (Signed)
Tried calling back to discuss. LMVM advising that Dr August Saucer wanted to see him prior to deciding restrictions for work. Advised we could move appointment up sooner if needed. Asked him to call back

## 2019-09-03 NOTE — Telephone Encounter (Signed)
See other note

## 2019-09-04 ENCOUNTER — Telehealth: Payer: Self-pay | Admitting: Orthopedic Surgery

## 2019-09-04 NOTE — Telephone Encounter (Signed)
Patient called back asked if he can see Dr August Saucer on 09/08/2019? The number to contact patient is [phone]

## 2019-09-04 NOTE — Telephone Encounter (Signed)
S/w patient. He will see Dr August Saucer on 08/09

## 2019-09-08 ENCOUNTER — Other Ambulatory Visit: Payer: Self-pay

## 2019-09-08 ENCOUNTER — Ambulatory Visit (INDEPENDENT_AMBULATORY_CARE_PROVIDER_SITE_OTHER): Payer: BC Managed Care – PPO | Admitting: Orthopedic Surgery

## 2019-09-08 DIAGNOSIS — S46012A Strain of muscle(s) and tendon(s) of the rotator cuff of left shoulder, initial encounter: Secondary | ICD-10-CM

## 2019-09-10 ENCOUNTER — Encounter: Payer: Self-pay | Admitting: Orthopedic Surgery

## 2019-09-10 NOTE — Progress Notes (Signed)
   Post-Op Visit Note   Patient: Manuel Ellis           Date of Birth: [birth year]           MRN: [id number] Visit Date: 09/08/2019 PCP: Blair Heys, MD   Assessment & Plan:  Chief Complaint:  Chief Complaint  Patient presents with  . Follow-up   Visit Diagnoses:  1. Traumatic tear of left rotator cuff, unspecified tear extent, initial encounter     Plan: [name] is now about 6 weeks out left shoulder rotator cuff repair.  On exam he is got about 30 degrees of external rotation of 15 degrees of abduction forward flexion and isolated glenohumeral abduction slightly above 90.  Rotator cuff strength feels good with no coarse grinding or crepitus with passive range of motion.  Started strengthening exercises 8-21.  Works as a Network engineer.  At this time I think he is okay to return to work 09/08/2019 with no lifting greater than 10 pounds with his left arm for 6 weeks.  6-week return for clinical recheck and likely release at that time.  He is slightly on the stiff side but I think with continued work in therapy that will improve.  Follow-Up Instructions: Return in about 6 weeks (around 10/20/2019).   Orders:  No orders of the defined types were placed in this encounter.  No orders of the defined types were placed in this encounter.   Imaging: No results found.  PMFS History: There are no problems to display for this patient.  History reviewed. No pertinent past medical history.  History reviewed. No pertinent family history.  History reviewed. No pertinent surgical history. Social History   Occupational History  . Not on file  Tobacco Use  . Smoking status: Never Smoker  . Smokeless tobacco: Never Used  Substance and Sexual Activity  . Alcohol use: Yes    Comment: occads  . Drug use: No  . Sexual activity: Not on file

## 2019-10-20 ENCOUNTER — Ambulatory Visit (INDEPENDENT_AMBULATORY_CARE_PROVIDER_SITE_OTHER): Payer: BC Managed Care – PPO | Admitting: Orthopedic Surgery

## 2019-10-20 DIAGNOSIS — S46012A Strain of muscle(s) and tendon(s) of the rotator cuff of left shoulder, initial encounter: Secondary | ICD-10-CM

## 2019-10-22 ENCOUNTER — Encounter: Payer: Self-pay | Admitting: Orthopedic Surgery

## 2019-10-22 NOTE — Progress Notes (Signed)
   Post-Op Visit Note   Patient: Manuel Ellis           Date of Birth: 12-29-1955           MRN: [id number] Visit Date: 10/20/2019 PCP: Blair Heys, MD   Assessment & Plan:  Chief Complaint:  Chief Complaint  Patient presents with  . Left Shoulder - Routine Post Op   Visit Diagnoses:  1. Traumatic tear of left rotator cuff, unspecified tear extent, initial encounter     Plan: Patient had to reschedule today because he left without being seen.  We will try to get him set up sometime in the near future  Follow-Up Instructions: No follow-ups on file.   Orders:  No orders of the defined types were placed in this encounter.  No orders of the defined types were placed in this encounter.   Imaging: No results found.  PMFS History: There are no problems to display for this patient.  History reviewed. No pertinent past medical history.  History reviewed. No pertinent family history.  History reviewed. No pertinent surgical history. Social History   Occupational History  . Not on file  Tobacco Use  . Smoking status: Never Smoker  . Smokeless tobacco: Never Used  Substance and Sexual Activity  . Alcohol use: Yes    Comment: occads  . Drug use: No  . Sexual activity: Not on file

## 2019-10-23 ENCOUNTER — Telehealth: Payer: Self-pay

## 2019-10-23 NOTE — Telephone Encounter (Signed)
Sending to you as reminder to call patient for follow up from Monday. [phone]

## 2019-11-18 NOTE — Telephone Encounter (Signed)
Hi Lauren I called him can you set him up for some Monday afternoon appointment within the next 1 to 2 weeks thanks

## 2019-11-19 NOTE — Telephone Encounter (Signed)
I called pt and was unable to reach him . Will hold to sch appt with Dr. August Saucer as below.

## 2019-11-19 NOTE — Telephone Encounter (Signed)
Appt sch for 111/21 at 3:15

## 2019-12-01 ENCOUNTER — Ambulatory Visit (INDEPENDENT_AMBULATORY_CARE_PROVIDER_SITE_OTHER): Payer: BC Managed Care – PPO | Admitting: Orthopedic Surgery

## 2019-12-01 ENCOUNTER — Encounter: Payer: Self-pay | Admitting: Orthopedic Surgery

## 2019-12-01 DIAGNOSIS — S46012A Strain of muscle(s) and tendon(s) of the rotator cuff of left shoulder, initial encounter: Secondary | ICD-10-CM

## 2019-12-03 ENCOUNTER — Encounter: Payer: Self-pay | Admitting: Orthopedic Surgery

## 2019-12-03 NOTE — Progress Notes (Signed)
   Post-Op Visit Note   Patient: Manuel Ellis           Date of Birth: [birth year]           MRN: [id number] Visit Date: 12/01/2019 PCP: Blair Heys, MD   Assessment & Plan:  Chief Complaint:  Chief Complaint  Patient presents with  . Left Shoulder - Pain   Visit Diagnoses:  1. Traumatic tear of left rotator cuff, unspecified tear extent, initial encounter     Plan: Manuel Ellis is a 64 year old patient underwent left shoulder rotator cuff repair 621.  Doing well.  Not taking any medication.  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  Does not have too many other such physical stresses on the left shoulder joint at this time.  On exam he has excellent range of motion and very good rotator cuff strength infraspinatus supraspinatus and subscap muscle testing.  At this time I want him to continue with no overhead lifting for the next 4 months and no lifting more than 30 pounds for the next 4 months.  After that I think he can resume activity as tolerated but I did caution him that his repaired rotator cuff is repaired but not like a brand-new rotator cuff.  All questions answered. Follow-Up Instructions: No follow-ups on file.   Orders:  No orders of the defined types were placed in this encounter.  No orders of the defined types were placed in this encounter.   Imaging: No results found.  PMFS History: There are no problems to display for this patient.  History reviewed. No pertinent past medical history.  History reviewed. No pertinent family history.  History reviewed. No pertinent surgical history. Social History   Occupational History  . Not on file  Tobacco Use  . Smoking status: Never Smoker  . Smokeless tobacco: Never Used  Substance and Sexual Activity  . Alcohol use: Yes    Comment: occads  . Drug use: No  . Sexual activity: Not on file

## 2020-08-04 ENCOUNTER — Encounter: Payer: Self-pay | Admitting: Orthopedic Surgery

## 2020-08-04 ENCOUNTER — Ambulatory Visit (INDEPENDENT_AMBULATORY_CARE_PROVIDER_SITE_OTHER): Payer: BC Managed Care – PPO | Admitting: Orthopedic Surgery

## 2020-08-04 DIAGNOSIS — M545 Low back pain, unspecified: Secondary | ICD-10-CM | POA: Diagnosis not present

## 2020-08-04 NOTE — Progress Notes (Signed)
Office Visit Note   Patient: Manuel Ellis           Date of Birth: 11-03-1955           MRN: [id number] Visit Date: 08/04/2020 Requested by: Blair Heys, MD [address] [place],  [place] [address] PCP: Blair Heys, MD  Subjective: Chief Complaint  Patient presents with   Left Shoulder - Follow-up    HPI: Manuel Ellis is a patient with left shoulder pain.  He is about a year out from left shoulder surgery.  He is doing well.  He also reports low back pain.  He has been to physical therapy and he has plain radiographs done over a year ago which shows some facet arthritis.  He has seen a chiropractor who has recommended and performed intervention and home exercise program for the patient.  MRI has not been done.  Symptoms been ongoing now for longer than a year but worse over the past 8 weeks.  He has tried taking over-the-counter medication and done the home exercise program from the chiropractor.  Nonetheless his symptoms of primarily low back pain persist with occasional radiation into the buttocks region.              ROS: All systems reviewed are negative as they relate to the chief complaint within the history of present illness.  Patient denies  fevers or chills.   Assessment & Plan: Visit Diagnoses:  1. Low back pain, unspecified back pain laterality, unspecified chronicity, unspecified whether sciatica present     Plan: Impression is left shoulder is doing well.  He could benefit from 1 or 2 more therapy visits for strengthening and achieving that terminal range of motion for overhead activities.  More importantly I think he might also benefit from therapy from a physical therapist for his low back pain which would be core strengthening hamstring stretching.  Based on his relatively frequent symptoms MRI scanning of the back with ESI's to follow could also be helpful to give him more of a pain-free window in which to rehab his back.  Plan MRI scan with ESI's  to follow we will see him back after that study.  Follow-Up Instructions: No follow-ups on file.   Orders:  Orders Placed This Encounter  Procedures   MR Lumbar Spine w/o contrast   No orders of the defined types were placed in this encounter.     Procedures: No procedures performed   Clinical Data: No additional findings.  Objective: Vital Signs: There were no vitals taken for this visit.  Physical Exam:   Constitutional: Patient appears well-developed HEENT:  Head: Normocephalic Eyes:EOM are normal Neck: Normal range of motion Cardiovascular: Normal rate Pulmonary/chest: Effort normal Neurologic: Patient is alert Skin: Skin is warm Psychiatric: Patient has normal mood and affect   Ortho Exam: Ortho exam demonstrates range of motion of the left shoulder at 45/95/170.  Rotator cuff strength is good infraspinatus x-ray subscap muscle testing with no AC joint tenderness.  No masses adenopathy or skin changes noted in the shoulder girdle region.  Radial pulses intact.  No nerve root tension signs.  Patient does have some pain with forward lateral bending.  Pedal pulses palpable.  No atrophy in the muscles of the legs.  No trochanteric tenderness is present.  Specialty Comments:  No specialty comments available.  Imaging: No results found.   PMFS History: There are no problems to display for this patient.  No past medical history on  file.  No family history on file.  No past surgical history on file. Social History   Occupational History   Not on file  Tobacco Use   Smoking status: Never   Smokeless tobacco: Never  Substance and Sexual Activity   Alcohol use: Yes    Comment: occads   Drug use: No   Sexual activity: Not on file

## 2020-08-15 ENCOUNTER — Ambulatory Visit
Admission: RE | Admit: 2020-08-15 | Discharge: 2020-08-15 | Disposition: A | Discharge status: Home / Self Care | Payer: BC Managed Care – PPO | Source: Ambulatory Visit | Attending: Orthopedic Surgery | Admitting: Orthopedic Surgery

## 2020-08-15 DIAGNOSIS — M545 Low back pain, unspecified: Secondary | ICD-10-CM

## 2020-08-16 NOTE — Progress Notes (Signed)
When is rov

## 2020-08-17 NOTE — Progress Notes (Signed)
Called and lvm with pt to schedule for Thursday of next week with Dr. August Saucer

## 2021-03-07 ENCOUNTER — Telehealth: Payer: Self-pay | Admitting: Orthopedic Surgery

## 2021-03-07 NOTE — Telephone Encounter (Signed)
Received call from patient looking for an MRI of his knee. I advised pt we don't have MRI of knee but, have MR of Lumbar spine and shoulder. He expressed he would like copy of the Lumbar Spine MR report and would like to get at his upcoming appt. I told him as long as it's just the one report we could do that day but if it's all his records he would sign release and would contact him later date when copy of all records ready.

## 2021-03-09 ENCOUNTER — Other Ambulatory Visit: Payer: Self-pay

## 2021-03-09 ENCOUNTER — Ambulatory Visit (INDEPENDENT_AMBULATORY_CARE_PROVIDER_SITE_OTHER): Payer: BC Managed Care – PPO

## 2021-03-09 ENCOUNTER — Encounter: Payer: Self-pay | Admitting: Orthopedic Surgery

## 2021-03-09 ENCOUNTER — Ambulatory Visit: Payer: Self-pay

## 2021-03-09 ENCOUNTER — Ambulatory Visit: Payer: BC Managed Care – PPO | Admitting: Orthopedic Surgery

## 2021-03-09 DIAGNOSIS — M25562 Pain in left knee: Secondary | ICD-10-CM

## 2021-03-09 DIAGNOSIS — G8929 Other chronic pain: Secondary | ICD-10-CM

## 2021-03-09 DIAGNOSIS — M25561 Pain in right knee: Secondary | ICD-10-CM

## 2021-03-09 DIAGNOSIS — M170 Bilateral primary osteoarthritis of knee: Secondary | ICD-10-CM

## 2021-03-09 NOTE — Progress Notes (Signed)
Office Visit Note   Patient: Manuel Ellis           Date of Birth: 11-Jul-1955           MRN: [id number] Visit Date: 03/09/2021 Requested by: Blair Heys, MD [address] [place],  [place] [address] PCP: Blair Heys, MD  Subjective: Chief Complaint  Patient presents with   Left Knee - Pain   Right Knee - Pain    HPI: [name] is a 66 year old patient with bilateral knee pain left worse than right.  Denies any locking or popping.  He does wear a brace.  Works at [employer] and may do that for another 1 to 2 years.  He has been taking Aleve and doing stretching.  He has had knee problems for years.  He has been in therapy also working on nonweightbearing quad strengthening exercises.              ROS: All systems reviewed are negative as they relate to the chief complaint within the history of present illness.  Patient denies  fevers or chills.   Assessment & Plan: Visit Diagnoses:  1. Chronic pain of both knees     Plan: Impression is bilateral knee pain left worse than right with radiographically worsening arthritis.  He has not had an injection and some mild.  We will try aspirating and injecting the left knee today.  He may be going on some vacations with his wife and we we will be ready to inject the knee if needed prior to that vacation time.  Otherwise continue current treatment.  May need knee replacement in the future but for now he is actually functioning quite well with the amount of radiographic arthritis that he has in both knees.  Follow-Up Instructions: No follow-ups on file.   Orders:  Orders Placed This Encounter  Procedures   XR Knee 1-2 Views Right   XR Knee 1-2 Views Left   No orders of the defined types were placed in this encounter.     Procedures: Large Joint Inj: L knee on 03/09/2021 5:41 PM Indications: diagnostic evaluation, joint swelling and pain Details: 18 G 1.5 in needle, superolateral approach  Arthrogram:  No  Medications: 5 mL lidocaine 1 %; 40 mg methylPREDNISolone acetate 40 MG/ML; 4 mL bupivacaine 0.25 % Outcome: tolerated well, no immediate complications Procedure, treatment alternatives, risks and benefits explained, specific risks discussed. Consent was given by the patient. Immediately prior to procedure a time out was called to verify the correct patient, procedure, equipment, support staff and site/side marked as required. Patient was prepped and draped in the usual sterile fashion.      Clinical Data: No additional findings.  Objective: Vital Signs: There were no vitals taken for this visit.  Physical Exam:   Constitutional: Patient appears well-developed HEENT:  Head: Normocephalic Eyes:EOM are normal Neck: Normal range of motion Cardiovascular: Normal rate Pulmonary/chest: Effort normal Neurologic: Patient is alert Skin: Skin is warm Psychiatric: Patient has normal mood and affect   Ortho Exam: Ortho exam demonstrates mild right knee effusion and mild left knee effusion.  Collateral crucial ligaments are stable.  Actually has slightly more flexion contracture of about 5 degrees on the right compared to the left.  He is able to bend both knees to about 110 degrees.  Pedal pulses palpable.  No groin pain with internal/external rotation of the legs.  Specialty Comments:  No specialty comments available.  Imaging: XR Knee 1-2 Views  Left  Result Date: 03/09/2021 AP lateral radiographs left knee reviewed.  Mild varus alignment present.  Bone-on-bone changes noted in the medial compartment with the lateral and patellofemoral compartments relatively spared.  No acute fracture.  XR Knee 1-2 Views Right  Result Date: 03/09/2021 AP lateral radiographs right knee reviewed.  Mild varus alignment is present.  Moderate to severe medial compartment arthritis is present with the lateral and patellofemoral compartments relatively well spared.    PMFS History: There are no problems  to display for this patient.  History reviewed. No pertinent past medical history.  History reviewed. No pertinent family history.  History reviewed. No pertinent surgical history. Social History   Occupational History   Not on file  Tobacco Use   Smoking status: Never   Smokeless tobacco: Never  Substance and Sexual Activity   Alcohol use: Yes    Comment: occads   Drug use: No   Sexual activity: Not on file

## 2021-03-10 MED ORDER — BUPIVACAINE HCL 0.25 % IJ SOLN
4.0000 mL | INTRAMUSCULAR | Status: AC | PRN
  Administered 2021-03-09: 4 mL via INTRA_ARTICULAR

## 2021-03-10 MED ORDER — METHYLPREDNISOLONE ACETATE 40 MG/ML IJ SUSP
40.0000 mg | INTRAMUSCULAR | Status: AC | PRN
  Administered 2021-03-09: 40 mg via INTRA_ARTICULAR

## 2021-03-10 MED ORDER — LIDOCAINE HCL 1 % IJ SOLN
5.0000 mL | INTRAMUSCULAR | Status: AC | PRN
  Administered 2021-03-09: 5 mL

## 2021-03-23 ENCOUNTER — Telehealth: Payer: Self-pay | Admitting: Orthopedic Surgery

## 2021-03-23 NOTE — Telephone Encounter (Signed)
Received medical records release form from patient  

## 2021-12-02 ENCOUNTER — Telehealth: Payer: Self-pay | Admitting: Orthopedic Surgery

## 2021-12-02 NOTE — Telephone Encounter (Signed)
Patient requesting copy of all knee xrays ,(including 2020) please copy to CD. Call pt when ready [phone]

## 2022-11-03 ENCOUNTER — Other Ambulatory Visit (HOSPITAL_BASED_OUTPATIENT_CLINIC_OR_DEPARTMENT_OTHER): Payer: Self-pay

## 2022-11-03 MED ORDER — COVID-19 MRNA VAC-TRIS(PFIZER) 30 MCG/0.3ML IM SUSY
0.3000 mL | PREFILLED_SYRINGE | Freq: Once | INTRAMUSCULAR | 0 refills | Status: AC
  Filled 2022-11-03: qty 0.3, 1d supply, fill #0

## 2022-11-03 MED ORDER — INFLUENZA VAC A&B SURF ANT ADJ 0.5 ML IM SUSY
0.5000 mL | PREFILLED_SYRINGE | Freq: Once | INTRAMUSCULAR | 0 refills | Status: AC
  Filled 2022-11-03: qty 0.5, 1d supply, fill #0

## 2023-06-06 IMAGING — MR MR LUMBAR SPINE W/O CM
4 of 5 series · 19 of 48 positions shown · non-contrast
Comparison: Radiography 02/11/2018

CLINICAL DATA: Back pain radiating to the right leg.

EXAM:
MRI LUMBAR SPINE WITHOUT CONTRAST
TECHNIQUE: Multiplanar, multisequence MR imaging of the lumbar spine was
performed. No intravenous contrast was administered.

[Series 5: T2 · sagittal · 4.0mm · 0.73mm/px · 6 of 15 slices shown (1 of 2)]
[im 1/15]
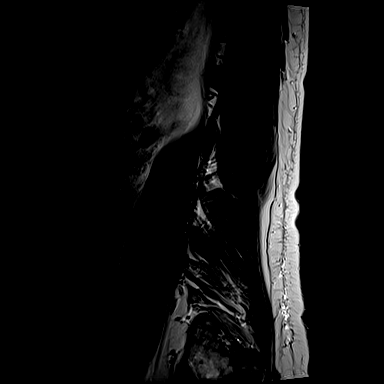
[im 3/15]
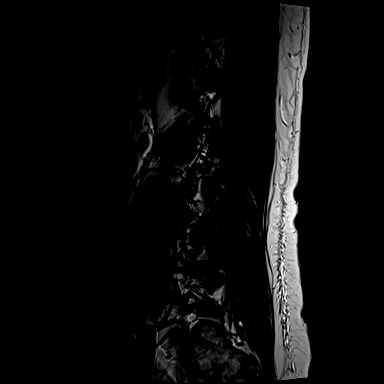
[im 6/15]
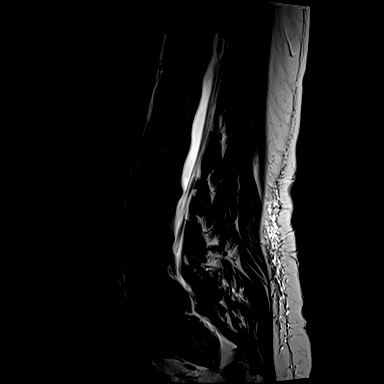
[im 9/15]
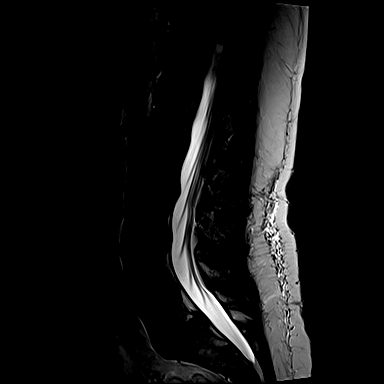
[im 12/15]
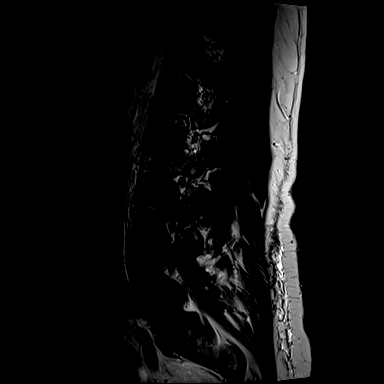
[im 15/15]
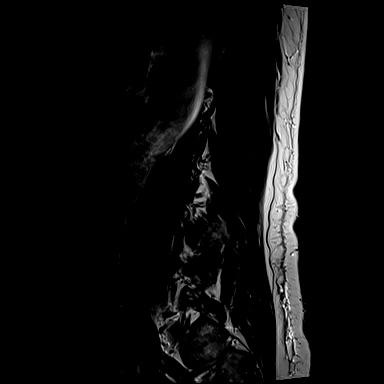

[Series 6: T1 · sagittal · 4.0mm · 1.09mm/px · 3 of 15 slices shown (1 of 2)]
[im 3/15]
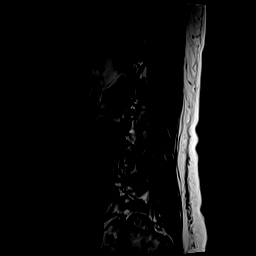
[im 9/15]
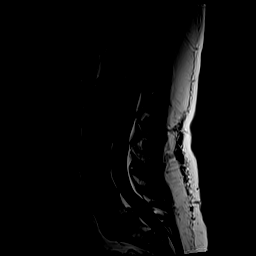
[im 15/15]
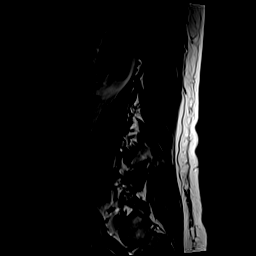

[Series 10: T1 · axial · 4.0mm · 0.28mm/px · z∈[-58,+98]mm · 3 of 41 slices shown (2 of 2)]
[im 6/41]
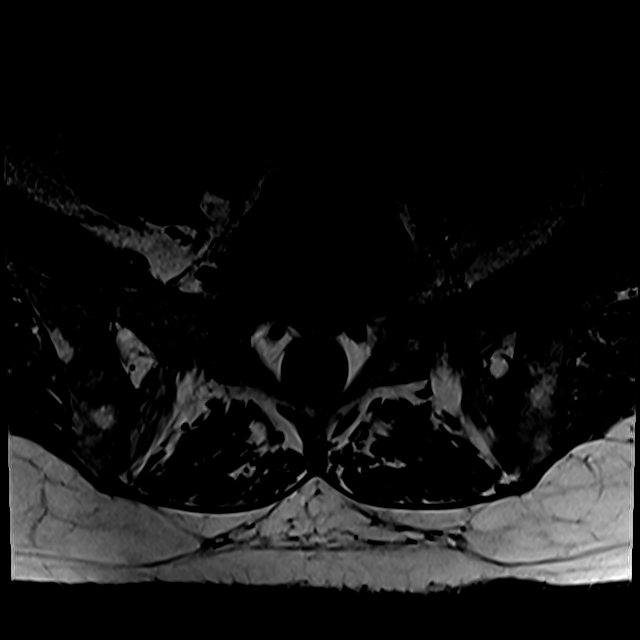
[im 21/41]
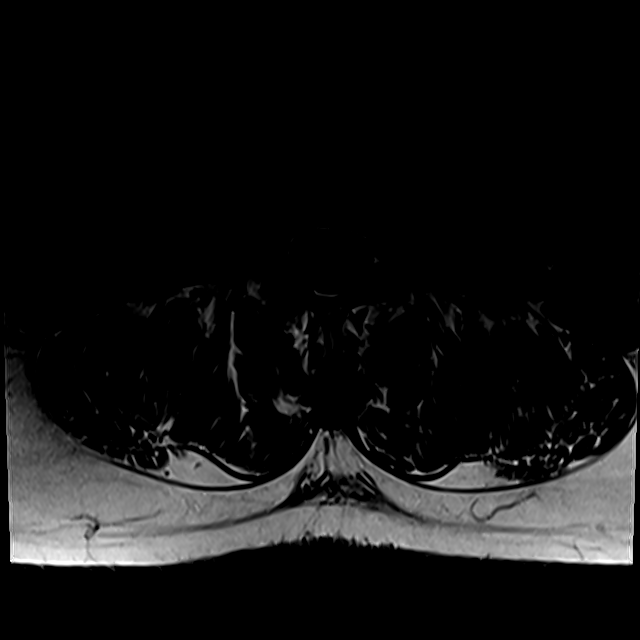
[im 35/41]
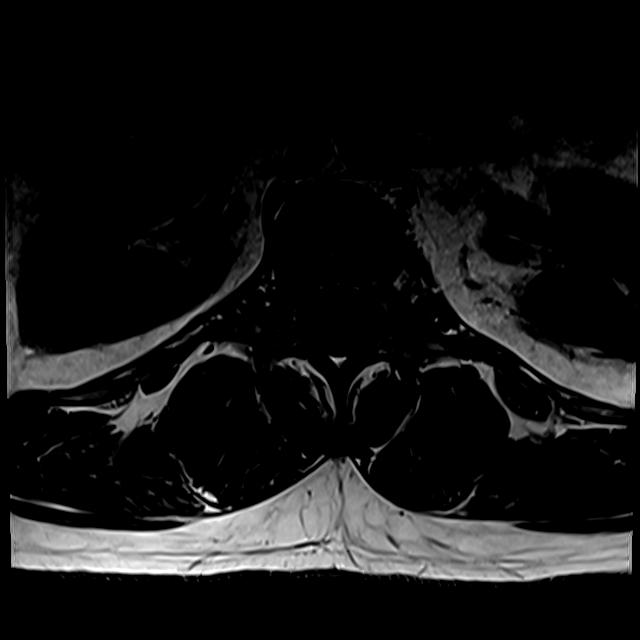

[Series 13: T2 · axial · 4.0mm · 0.28mm/px · z∈[-83,+98]mm · 7 of 41 slices shown (2 of 2)]
[im 1/41]
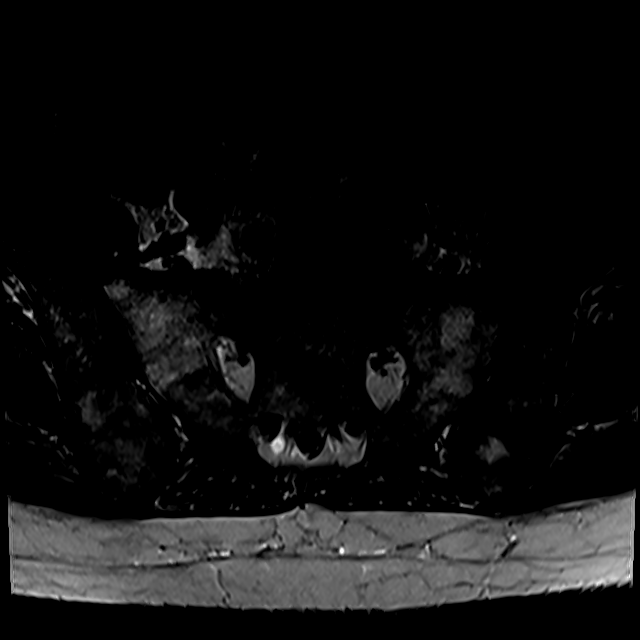
[im 6/41]
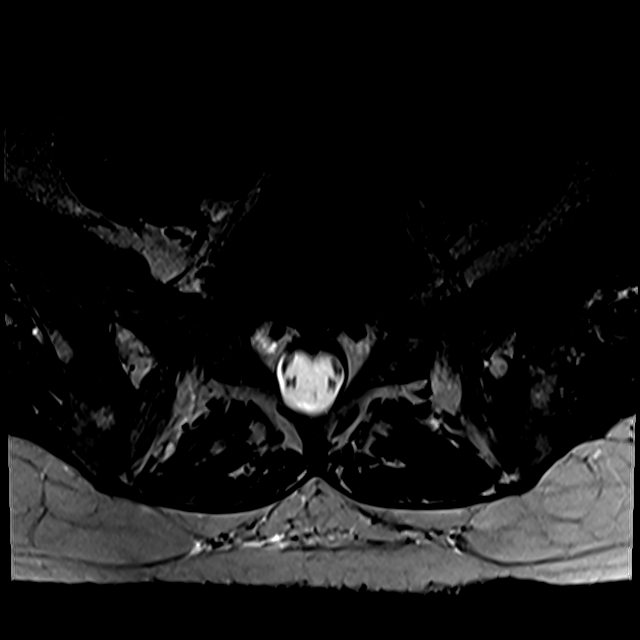
[im 12/41]
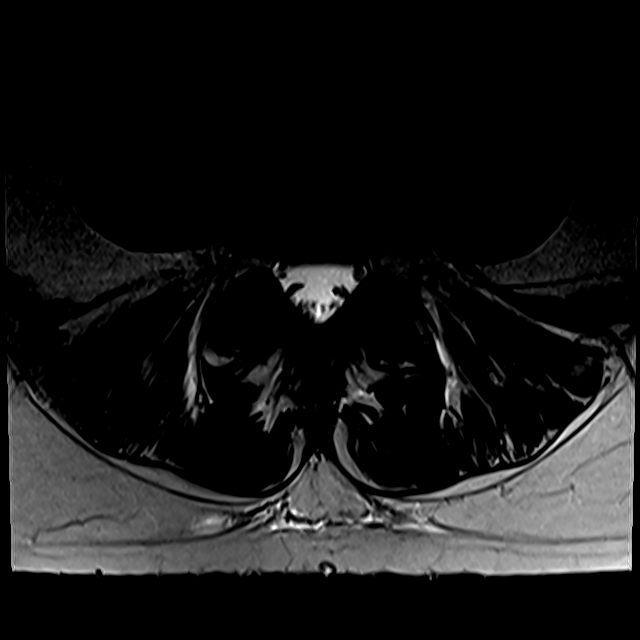
[im 18/41]
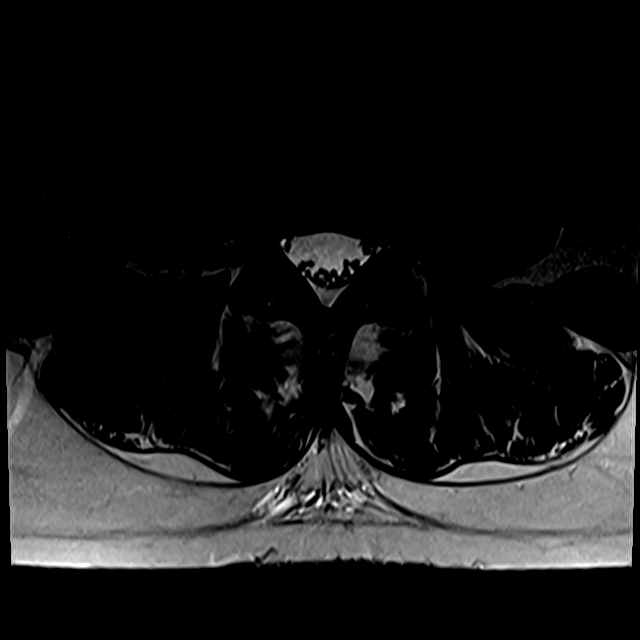
[im 21/41]
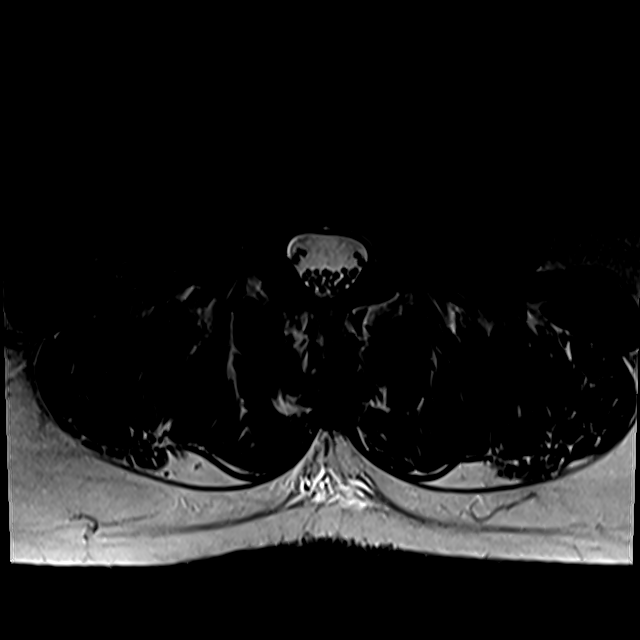
[im 23/41]
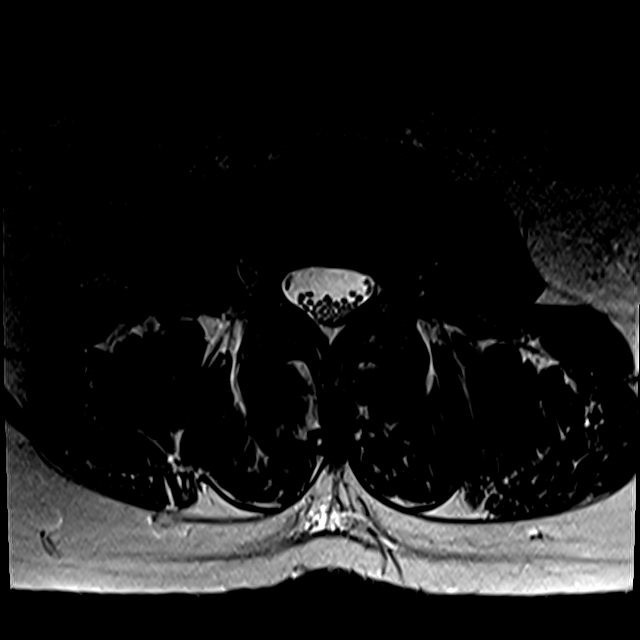
[im 35/41]
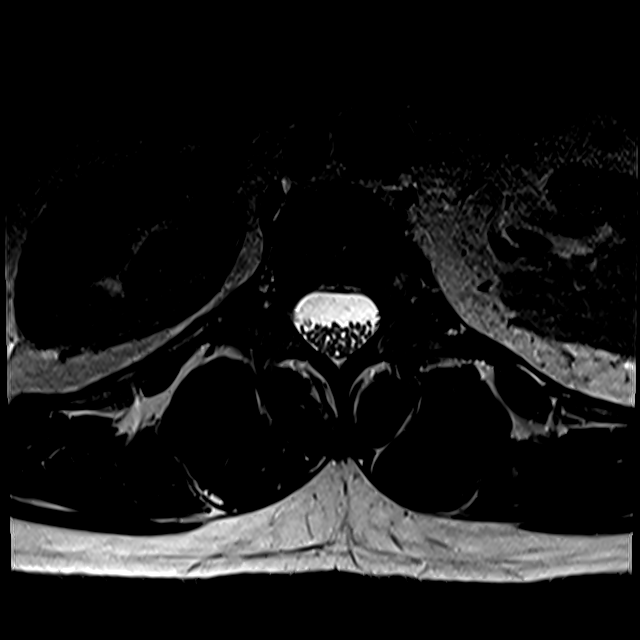

[19 of 48 positions shown; findings below may reference images not displayed]

FINDINGS: Segmentation:  5 lumbar type vertebral bodies.

Alignment:  2-3 mm degenerative anterolisthesis at L4-5 and L5-S1.

Vertebrae:  No fracture or focal bone lesion.

Conus medullaris and cauda equina: Conus extends to the T12-L1
level. Conus and cauda equina appear normal.

Paraspinal and other soft tissues: Negative

Disc levels:

No abnormality at T11-12, T12-L1 or L1-2.

L2-3: Mild bulging of the disc. No compressive narrowing of the
canal or foramina.

L3-4: Minimal bulging of the disc.  No stenosis.

L4-5: Bilateral facet osteoarthritis with 2-3 mm of anterolisthesis.
Minimal bulging of the disc. No compressive narrowing of canal or
foramina. Facet arthritis could be a cause of back pain or referred
facet syndrome pain.

L5-S1: Bilateral facet osteoarthritis with 2-3 mm of
anterolisthesis. Shallow disc herniation contacts the thecal sac but
does not have any visible compressive effect upon the thecal sac or
S1 nerves. There is only mild bilateral foraminal narrowing.
IMPRESSION: At L4-5 and L5-S1, there is chronic facet osteoarthritis with 2-3 mm
of anterolisthesis. This could be a cause of low back pain or
referred facet syndrome pain. No apparent neural compression at the
L4-5 level. At L5-S1, there is a shallow disc herniation which
contacts thecal sac but does not visibly compress the neural
structures.

## 2023-07-12 ENCOUNTER — Telehealth: Payer: Self-pay | Admitting: Orthopedic Surgery

## 2023-07-12 NOTE — Telephone Encounter (Signed)
 Received vm from patient about copy of medical records. IC,lmvm advised pt to come by our office to complete authorization for copy of medical records.

## 2023-11-02 ENCOUNTER — Other Ambulatory Visit (HOSPITAL_BASED_OUTPATIENT_CLINIC_OR_DEPARTMENT_OTHER): Payer: Self-pay

## 2023-11-02 MED ORDER — FLUZONE HIGH-DOSE 0.5 ML IM SUSY
0.5000 mL | PREFILLED_SYRINGE | Freq: Once | INTRAMUSCULAR | 0 refills | Status: AC
  Filled 2023-11-02: qty 0.5, 1d supply, fill #0

## 2023-11-02 MED ORDER — COMIRNATY 30 MCG/0.3ML IM SUSY
0.3000 mL | PREFILLED_SYRINGE | Freq: Once | INTRAMUSCULAR | 0 refills | Status: AC
  Filled 2023-11-02: qty 0.3, 1d supply, fill #0

## 2023-12-03 ENCOUNTER — Encounter: Payer: Self-pay | Admitting: Radiology
